# Patient Record
Sex: Male | Born: 1960 | Race: Black or African American | Hispanic: No | Marital: Married | Smoking: Current every day smoker
Health system: Southern US, Community
[De-identification: ages and names within clinical notes are randomized; demographics above are authoritative.]

---

## 2014-04-19 ENCOUNTER — Emergency Department (HOSPITAL_COMMUNITY)
Admission: EM | Admit: 2014-04-19 | Discharge: 2014-04-19 | Disposition: A | Discharge status: Home / Self Care | Payer: Self-pay | Attending: Emergency Medicine | Admitting: Emergency Medicine

## 2014-04-19 ENCOUNTER — Emergency Department (HOSPITAL_COMMUNITY): Payer: PRIVATE HEALTH INSURANCE

## 2014-04-19 ENCOUNTER — Encounter (HOSPITAL_COMMUNITY): Payer: Self-pay | Admitting: Emergency Medicine

## 2014-04-19 DIAGNOSIS — R209 Unspecified disturbances of skin sensation: Secondary | ICD-10-CM | POA: Insufficient documentation

## 2014-04-19 DIAGNOSIS — F17200 Nicotine dependence, unspecified, uncomplicated: Secondary | ICD-10-CM | POA: Insufficient documentation

## 2014-04-19 DIAGNOSIS — M65849 Other synovitis and tenosynovitis, unspecified hand: Principal | ICD-10-CM

## 2014-04-19 DIAGNOSIS — M779 Enthesopathy, unspecified: Secondary | ICD-10-CM

## 2014-04-19 DIAGNOSIS — M65839 Other synovitis and tenosynovitis, unspecified forearm: Secondary | ICD-10-CM | POA: Insufficient documentation

## 2014-04-19 MED ORDER — NAPROXEN 500 MG PO TABS: 500.0000 mg | ORAL_TABLET | Freq: Two times a day (BID) | ORAL | Status: AC

## 2014-04-19 NOTE — Discharge Instructions (Signed)
Follow up with DR.  Fuller CanadaStanley Harrison next week if not improving

## 2014-04-19 NOTE — ED Provider Notes (Addendum)
CSN: [id number]     Arrival date & time 04/19/14  1121 History  This chart was scribed for Benny LennertJoseph L Abbi Mancini, MD by Roxy Cedarhandni Bhalodia, ED Scribe. This patient was seen in room [facility]04/[facility]04 and the patient's care was started at 11:43 AM. Chief Complaint  Patient presents with  . Numbness    Patient is a 53 y.o. male presenting with extremity pain. The history is provided by the patient. No language interpreter was used.  Extremity Pain This is a new problem. The current episode started yesterday. The problem occurs constantly. The problem has been gradually worsening. Pertinent negatives include no chest pain, no abdominal pain and no headaches. Exacerbated by: movements. Nothing relieves the symptoms. He has tried nothing for the symptoms.    HPI Comments: Edward Harding is a 53 y.o. male who presents to the Emergency Department complaining of numbness in both arms onset yesterday.  Patient reports associated soreness on upper left arm. Patient states numbness began while working.  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   History reviewed. No pertinent past medical history. History reviewed. No pertinent past surgical history. Family History  Problem Relation Age of Onset  . Diabetes Other    History  Substance Use Topics  . Smoking status: Current Every Day Smoker -- 0.50 packs/day for 14 years    Types: Cigarettes  . Smokeless tobacco: Never Used  . Alcohol Use: No    Review of Systems  Constitutional: Negative for appetite change and fatigue.  HENT: Negative for congestion, ear discharge and sinus pressure.   Eyes: Negative for discharge.  Respiratory: Negative for cough.   Cardiovascular: Negative for chest pain.  Gastrointestinal: Negative for abdominal pain and diarrhea.  Genitourinary: Negative for frequency and hematuria.  Musculoskeletal: Negative for back pain.       Pain to upper left arm  Skin: Negative for rash.  Neurological:  Positive for numbness (Right Arm and Left Arm). Negative for seizures and headaches.  Psychiatric/Behavioral: Negative for hallucinations.    Allergies  Review of patient's allergies indicates no known allergies.  Home Medications   Prior to Admission medications   Not on File   Triage Vitals: BP 152/95  Pulse 57  Temp(Src) 97.8 F (36.6 C) (Oral)  Resp 14  Ht 6\' 1"  (1.854 m)  Wt 180 lb (81.647 kg)  BMI 23.75 kg/m2  SpO2 100% Physical Exam  Constitutional: He is oriented to person, place, and time. He appears well-developed.  HENT:  Head: Normocephalic.  Eyes: Conjunctivae and EOM are normal. No scleral icterus.  Neck: Neck supple. No tracheal deviation present. No thyromegaly present.  Cardiovascular: Normal rate and regular rhythm.  Exam reveals no gallop and no friction rub.   No murmur heard. Pulmonary/Chest: No stridor. He has no wheezes. He has no rales. He exhibits no tenderness.  Abdominal: He exhibits no distension. There is no tenderness. There is no rebound.  Musculoskeletal: Normal range of motion. He exhibits tenderness. He exhibits no edema.  Tenderness to medial aspect of left elbow  Lymphadenopathy:    He has no cervical adenopathy.  Neurological: He is oriented to person, place, and time. He exhibits normal muscle tone. Coordination normal.  Slight numbness to distal left arm.   Skin: Skin is warm. No rash noted. No erythema.  Psychiatric: He has a normal mood and affect. His behavior is normal.    ED Course  Procedures (including critical care time)  DIAGNOSTIC STUDIES: Oxygen  Saturation is 100% on RA, normal by my interpretation.    COORDINATION OF CARE: 11:457i AM- Pt advised of plan for treatment and pt agrees.  Labs Review Labs Reviewed - No data to display  Imaging Review No results found.   EKG Interpretation None      MDM   Final diagnoses:  None  tendonitis   The chart was scribed for me under my direct supervision.  I  personally performed the history, physical, and medical decision making and all procedures in the evaluation of this patient.Benny Lennert, MD 04/19/14 1348  Benny Lennert, MD 04/19/14 [id number]

## 2014-04-19 NOTE — ED Notes (Signed)
Patient with no complaints at this time. Respirations even and unlabored. Skin warm/dry. Discharge instructions reviewed with patient at this time. Patient given opportunity to voice concerns/ask questions. IV removed per policy and band-aid applied to site. Patient discharged at this time and left Emergency Department with steady gait.  

## 2014-04-19 NOTE — ED Notes (Signed)
Patient c/o numbness and weakness in arms bilaterally. Per patient started last night in right arm and then this morning in left arm. Per patient weakness more noticeable on left side, unable to hold objects. Patient denies any pain, dizziness, slurred speech, or headache. 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

## 2015-11-26 IMAGING — CR DG ELBOW COMPLETE 3+V*L*
4 series · 4 of 4 positions shown · non-contrast
Comparison: None.

CLINICAL DATA: Left elbow numbness.  Injury.

EXAM:
LEFT ELBOW - COMPLETE 3+ VIEW

[view not recorded (1 of 4)]
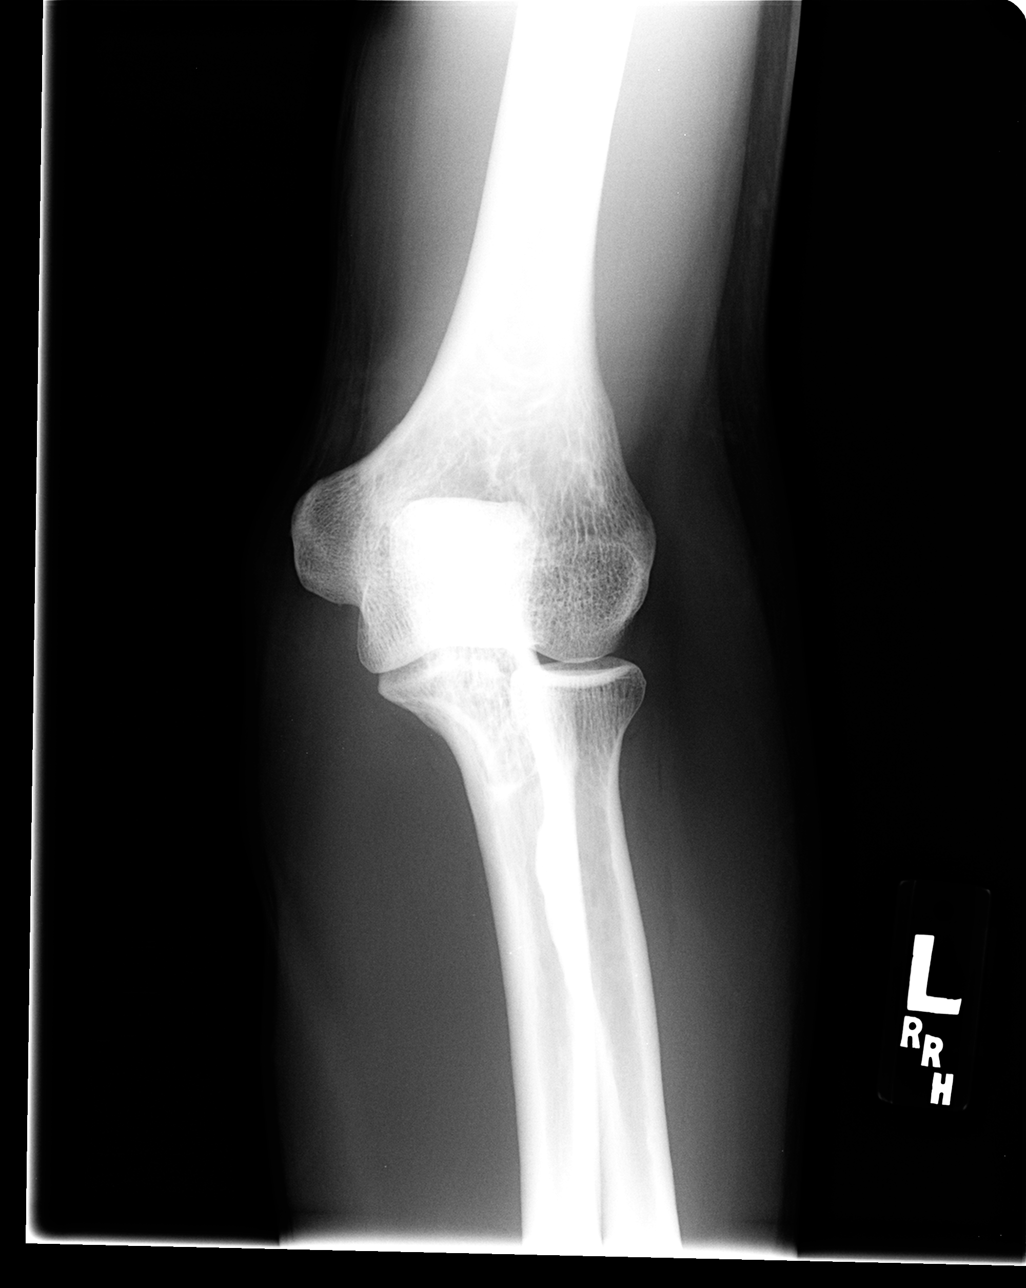

[view not recorded (2 of 4)]
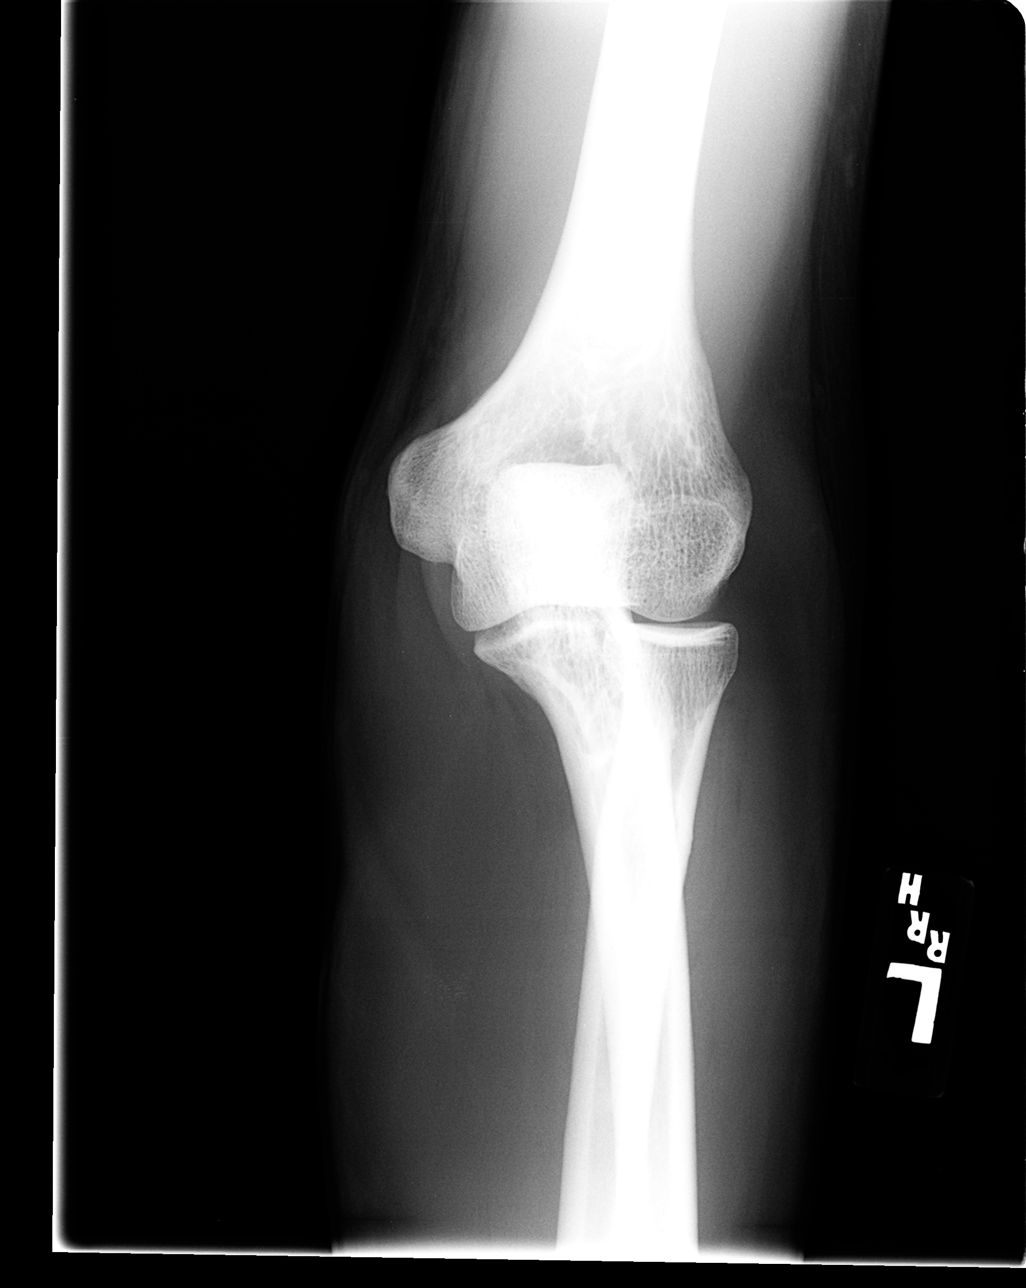

[view not recorded (3 of 4)]
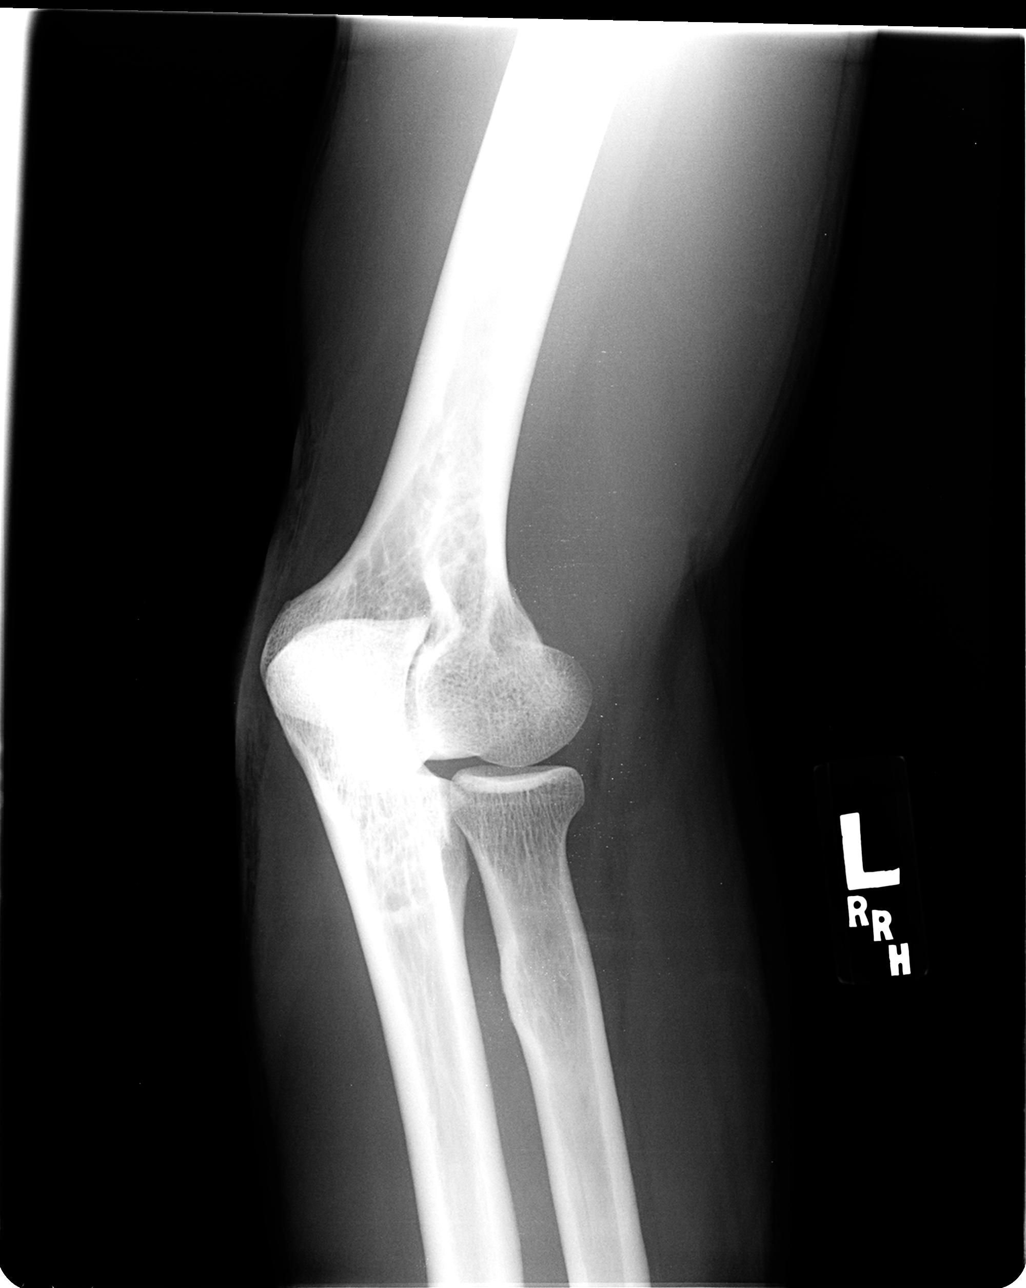

[view not recorded (4 of 4)]
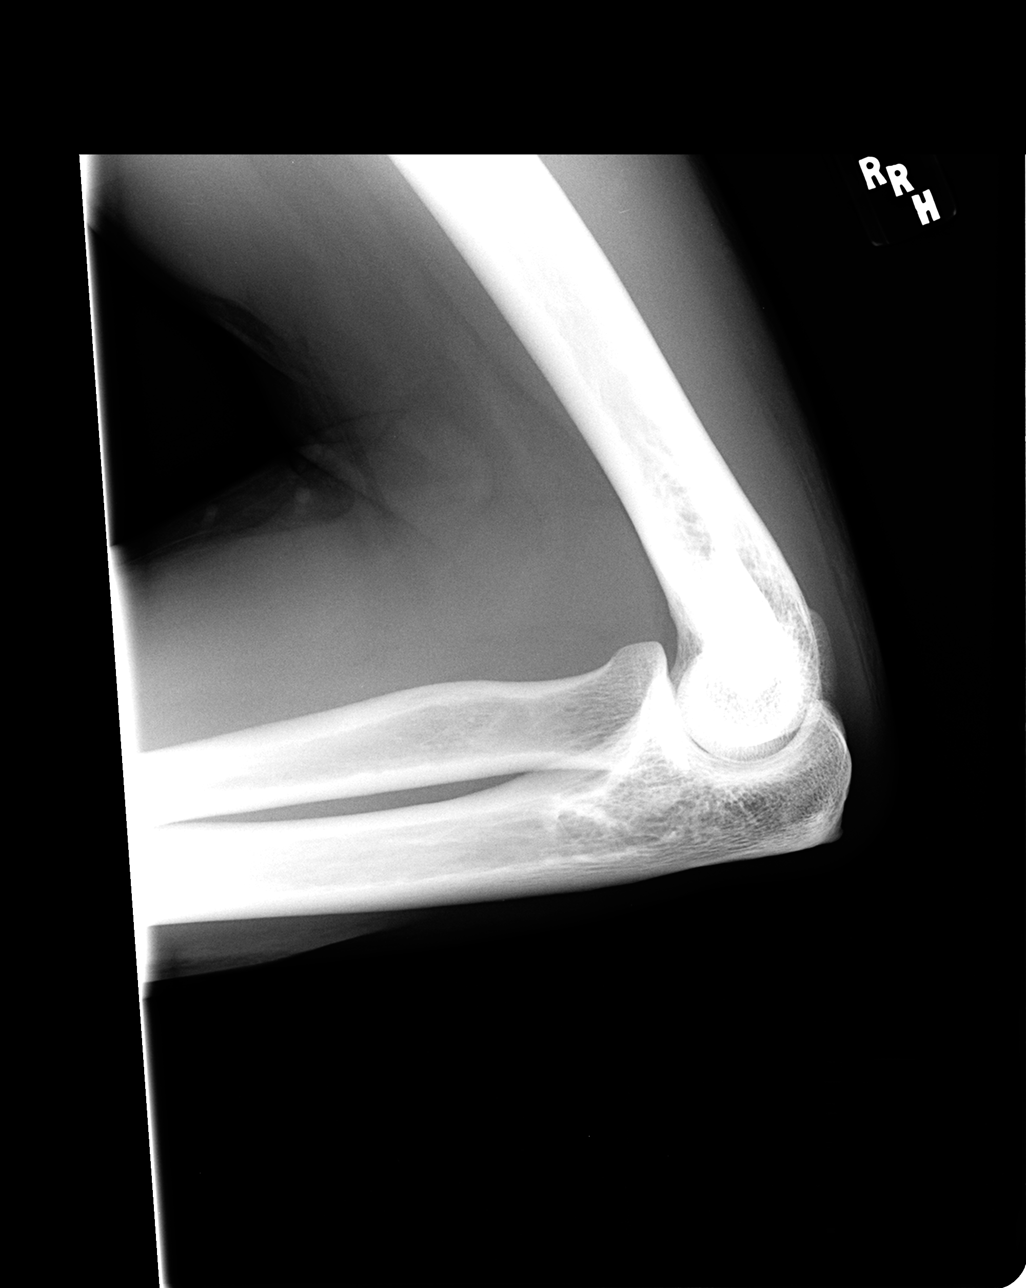

[4 of 4 positions shown; findings below may reference images not displayed]

FINDINGS: There is no evidence of fracture, dislocation, or joint effusion.
There is no evidence of arthropathy or other focal bone abnormality.
Soft tissues are unremarkable.
IMPRESSION: Negative.
